# Patient Record
Sex: Female | Born: 1960 | Race: White | Hispanic: No
Health system: Southern US, Community
[De-identification: ages and names within clinical notes are randomized; demographics above are authoritative.]

---

## 2008-01-01 ENCOUNTER — Ambulatory Visit: Payer: Self-pay | Admitting: Family Medicine

## 2008-02-18 ENCOUNTER — Ambulatory Visit: Payer: Self-pay | Admitting: Family Medicine

## 2008-03-19 ENCOUNTER — Ambulatory Visit: Payer: Self-pay | Admitting: Family Medicine

## 2008-08-03 ENCOUNTER — Encounter: Admission: RE | Admit: 2008-08-03 | Discharge: 2008-08-03 | Payer: Self-pay | Admitting: Family Medicine

## 2009-09-28 ENCOUNTER — Encounter: Admission: RE | Admit: 2009-09-28 | Discharge: 2009-09-28 | Payer: Self-pay | Admitting: Family Medicine

## 2010-10-13 ENCOUNTER — Other Ambulatory Visit: Payer: Self-pay | Admitting: Family Medicine

## 2010-10-13 DIAGNOSIS — Z1231 Encounter for screening mammogram for malignant neoplasm of breast: Secondary | ICD-10-CM

## 2010-10-31 ENCOUNTER — Ambulatory Visit: Payer: Self-pay

## 2010-11-30 ENCOUNTER — Ambulatory Visit
Admission: RE | Admit: 2010-11-30 | Discharge: 2010-11-30 | Disposition: A | Discharge status: Home / Self Care | Payer: BC Managed Care – PPO | Source: Ambulatory Visit | Attending: Family Medicine | Admitting: Family Medicine

## 2010-11-30 DIAGNOSIS — Z1231 Encounter for screening mammogram for malignant neoplasm of breast: Secondary | ICD-10-CM

## 2012-01-04 ENCOUNTER — Other Ambulatory Visit: Payer: Self-pay | Admitting: Family Medicine

## 2012-01-04 DIAGNOSIS — Z1231 Encounter for screening mammogram for malignant neoplasm of breast: Secondary | ICD-10-CM

## 2012-01-30 ENCOUNTER — Ambulatory Visit
Admission: RE | Admit: 2012-01-30 | Discharge: 2012-01-30 | Disposition: A | Discharge status: Home / Self Care | Payer: BC Managed Care – PPO | Source: Ambulatory Visit | Attending: Family Medicine | Admitting: Family Medicine

## 2012-01-30 DIAGNOSIS — Z1231 Encounter for screening mammogram for malignant neoplasm of breast: Secondary | ICD-10-CM

## 2012-01-31 ENCOUNTER — Other Ambulatory Visit: Payer: Self-pay | Admitting: Family Medicine

## 2012-01-31 DIAGNOSIS — R928 Other abnormal and inconclusive findings on diagnostic imaging of breast: Secondary | ICD-10-CM

## 2012-02-12 ENCOUNTER — Ambulatory Visit
Admission: RE | Admit: 2012-02-12 | Discharge: 2012-02-12 | Disposition: A | Discharge status: Home / Self Care | Payer: BC Managed Care – PPO | Source: Ambulatory Visit | Attending: Family Medicine | Admitting: Family Medicine

## 2012-02-12 DIAGNOSIS — R928 Other abnormal and inconclusive findings on diagnostic imaging of breast: Secondary | ICD-10-CM

## 2012-11-21 ENCOUNTER — Ambulatory Visit: Payer: BC Managed Care – PPO | Attending: Family Medicine | Admitting: Physical Therapy

## 2013-01-30 ENCOUNTER — Ambulatory Visit: Payer: BC Managed Care – PPO | Attending: Family Medicine | Admitting: Physical Therapy

## 2013-01-30 DIAGNOSIS — M2420 Disorder of ligament, unspecified site: Secondary | ICD-10-CM | POA: Insufficient documentation

## 2013-01-30 DIAGNOSIS — IMO0001 Reserved for inherently not codable concepts without codable children: Secondary | ICD-10-CM | POA: Insufficient documentation

## 2013-01-30 DIAGNOSIS — M629 Disorder of muscle, unspecified: Secondary | ICD-10-CM | POA: Insufficient documentation

## 2013-02-10 ENCOUNTER — Ambulatory Visit: Payer: BC Managed Care – PPO | Admitting: Physical Therapy

## 2013-02-13 ENCOUNTER — Ambulatory Visit: Payer: BC Managed Care – PPO | Admitting: Physical Therapy

## 2013-02-26 ENCOUNTER — Ambulatory Visit: Payer: BC Managed Care – PPO | Admitting: Physical Therapy

## 2013-03-14 ENCOUNTER — Other Ambulatory Visit: Payer: Self-pay | Admitting: Family Medicine

## 2013-03-14 DIAGNOSIS — Z1231 Encounter for screening mammogram for malignant neoplasm of breast: Secondary | ICD-10-CM

## 2013-03-31 ENCOUNTER — Ambulatory Visit: Payer: BC Managed Care – PPO

## 2013-05-16 ENCOUNTER — Ambulatory Visit
Admission: RE | Admit: 2013-05-16 | Discharge: 2013-05-16 | Disposition: A | Discharge status: Home / Self Care | Payer: BC Managed Care – PPO | Source: Ambulatory Visit | Attending: Family Medicine | Admitting: Family Medicine

## 2013-05-16 ENCOUNTER — Encounter (INDEPENDENT_AMBULATORY_CARE_PROVIDER_SITE_OTHER): Payer: Self-pay

## 2013-05-16 DIAGNOSIS — Z1231 Encounter for screening mammogram for malignant neoplasm of breast: Secondary | ICD-10-CM

## 2013-06-06 ENCOUNTER — Ambulatory Visit: Payer: BC Managed Care – PPO | Attending: Family Medicine | Admitting: Physical Therapy

## 2013-06-06 DIAGNOSIS — M629 Disorder of muscle, unspecified: Secondary | ICD-10-CM | POA: Insufficient documentation

## 2013-06-06 DIAGNOSIS — M2420 Disorder of ligament, unspecified site: Secondary | ICD-10-CM | POA: Insufficient documentation

## 2013-06-06 DIAGNOSIS — IMO0001 Reserved for inherently not codable concepts without codable children: Secondary | ICD-10-CM | POA: Insufficient documentation

## 2013-06-06 DIAGNOSIS — R32 Unspecified urinary incontinence: Secondary | ICD-10-CM | POA: Insufficient documentation

## 2013-06-27 ENCOUNTER — Ambulatory Visit: Payer: BC Managed Care – PPO | Admitting: Physical Therapy

## 2013-08-12 ENCOUNTER — Ambulatory Visit: Payer: BC Managed Care – PPO | Admitting: Physical Therapy

## 2014-07-03 ENCOUNTER — Other Ambulatory Visit: Payer: Self-pay | Admitting: Nurse Practitioner

## 2014-07-03 DIAGNOSIS — M810 Age-related osteoporosis without current pathological fracture: Secondary | ICD-10-CM

## 2014-07-03 DIAGNOSIS — Z1231 Encounter for screening mammogram for malignant neoplasm of breast: Secondary | ICD-10-CM

## 2014-08-05 ENCOUNTER — Ambulatory Visit: Payer: BC Managed Care – PPO

## 2014-08-05 ENCOUNTER — Other Ambulatory Visit: Payer: BC Managed Care – PPO

## 2014-08-21 ENCOUNTER — Other Ambulatory Visit: Payer: Self-pay | Admitting: Orthopedic Surgery

## 2014-08-21 DIAGNOSIS — M25511 Pain in right shoulder: Secondary | ICD-10-CM

## 2014-08-27 ENCOUNTER — Ambulatory Visit
Admission: RE | Admit: 2014-08-27 | Discharge: 2014-08-27 | Disposition: A | Discharge status: Home / Self Care | Payer: BC Managed Care – PPO | Source: Ambulatory Visit | Attending: Nurse Practitioner | Admitting: Nurse Practitioner

## 2014-08-27 DIAGNOSIS — Z1231 Encounter for screening mammogram for malignant neoplasm of breast: Secondary | ICD-10-CM

## 2014-08-27 DIAGNOSIS — M810 Age-related osteoporosis without current pathological fracture: Secondary | ICD-10-CM

## 2014-09-02 ENCOUNTER — Other Ambulatory Visit: Payer: BC Managed Care – PPO

## 2015-08-17 ENCOUNTER — Other Ambulatory Visit: Payer: Self-pay | Admitting: Nurse Practitioner

## 2015-08-17 DIAGNOSIS — Z1231 Encounter for screening mammogram for malignant neoplasm of breast: Secondary | ICD-10-CM

## 2015-08-30 ENCOUNTER — Ambulatory Visit
Admission: RE | Admit: 2015-08-30 | Discharge: 2015-08-30 | Disposition: A | Discharge status: Home / Self Care | Payer: BC Managed Care – PPO | Source: Ambulatory Visit | Attending: Nurse Practitioner | Admitting: Nurse Practitioner

## 2015-08-30 DIAGNOSIS — Z1231 Encounter for screening mammogram for malignant neoplasm of breast: Secondary | ICD-10-CM

## 2016-09-05 ENCOUNTER — Other Ambulatory Visit: Payer: Self-pay | Admitting: Family Medicine

## 2016-09-05 DIAGNOSIS — Z1231 Encounter for screening mammogram for malignant neoplasm of breast: Secondary | ICD-10-CM

## 2016-09-14 ENCOUNTER — Ambulatory Visit
Admission: RE | Admit: 2016-09-14 | Discharge: 2016-09-14 | Disposition: A | Discharge status: Home / Self Care | Payer: BC Managed Care – PPO | Source: Ambulatory Visit | Attending: Family Medicine | Admitting: Family Medicine

## 2016-09-14 DIAGNOSIS — Z1231 Encounter for screening mammogram for malignant neoplasm of breast: Secondary | ICD-10-CM

## 2017-06-10 ENCOUNTER — Encounter (HOSPITAL_COMMUNITY): Payer: Self-pay | Admitting: Emergency Medicine

## 2017-06-10 ENCOUNTER — Emergency Department (HOSPITAL_COMMUNITY): Payer: BC Managed Care – PPO

## 2017-06-10 ENCOUNTER — Emergency Department (HOSPITAL_COMMUNITY)
Admission: EM | Admit: 2017-06-10 | Discharge: 2017-06-10 | Disposition: A | Discharge status: Home / Self Care | Payer: BC Managed Care – PPO | Attending: Emergency Medicine | Admitting: Emergency Medicine

## 2017-06-10 DIAGNOSIS — Y9301 Activity, walking, marching and hiking: Secondary | ICD-10-CM | POA: Diagnosis not present

## 2017-06-10 DIAGNOSIS — S0993XA Unspecified injury of face, initial encounter: Secondary | ICD-10-CM | POA: Diagnosis present

## 2017-06-10 DIAGNOSIS — Y92520 Airport as the place of occurrence of the external cause: Secondary | ICD-10-CM | POA: Insufficient documentation

## 2017-06-10 DIAGNOSIS — W100XXA Fall (on)(from) escalator, initial encounter: Secondary | ICD-10-CM | POA: Insufficient documentation

## 2017-06-10 DIAGNOSIS — S0181XA Laceration without foreign body of other part of head, initial encounter: Secondary | ICD-10-CM | POA: Insufficient documentation

## 2017-06-10 DIAGNOSIS — Y999 Unspecified external cause status: Secondary | ICD-10-CM | POA: Insufficient documentation

## 2017-06-10 NOTE — ED Provider Notes (Signed)
[facility]-EMERGENCY DEPT Provider Note   CSN: [id number] Arrival date & time: 06/10/17  1237     History   Chief Complaint Chief Complaint  Patient presents with  . Fall  . Laceration    HPI Victoria Jacobs is a 57 y.o. female.  Victoria Jacobs is a 57 y.o. Female who is otherwise healthy, presents to the emergency department via EMS for evaluation of fall and facial laceration.  [place] she tripped and fell striking her chin and right cheek.  Patient reports mild headache but denies any loss of consciousness, no vision changes, nausea, vomiting, dizziness.  Patient denies any weakness numbness or tingling in any of her extremities.  Patient is not on any blood thinners.  She reports her chin received most of the impact she has a small laceration to the underside of her right jaw and a small abrasion to the right cheek.  Patient denies any neck pain, she did not injure anything else from the fall.     History reviewed. No pertinent past medical history.  There are no active problems to display for this patient.   History reviewed. No pertinent surgical history.   OB History   None      Home Medications    Prior to Admission medications   Not on File    Family History No family history on file.  Social History Social History   Tobacco Use  . Smoking status: Not on file  Substance Use Topics  . Alcohol use: Not on file  . Drug use: Not on file     Allergies   Patient has no known allergies.   Review of Systems Review of Systems  Constitutional: Negative for chills and fever.  HENT: Negative for dental problem, ear pain and facial swelling.   Eyes: Negative for pain and visual disturbance.  Gastrointestinal: Negative for nausea and vomiting.  Musculoskeletal: Negative for arthralgias, back pain, myalgias, neck pain and neck stiffness.  Skin: Positive for wound.    Neurological: Positive for headaches. Negative for dizziness, tremors, seizures, syncope, facial asymmetry, speech difficulty, weakness, light-headedness and numbness.     Physical Exam Updated Vital Signs BP 117/76   Pulse 76   Temp 98.6 F (37 C)   Resp 18   SpO2 97%   Physical Exam  Constitutional: She is oriented to person, place, and time. She appears well-developed and well-nourished. No distress.  HENT:  Head: Normocephalic and atraumatic.  Focal tenderness to palpation over the chin and right jawline with small 1 cm laceration to the underside of the right jaw, there is also tenderness to palpation over the right cheek with small abrasion, no evidence of injury to the nose, no tenderness or palpable hematoma over the scalp, no step-off, no battle sign or hemotympanum.  Eyes: Pupils are equal, round, and reactive to light. EOM are normal. Right eye exhibits no discharge. Left eye exhibits no discharge.  No nystagmus  Neck:  No midline C-spine tenderness, range of motion intact  Pulmonary/Chest: Effort normal. No respiratory distress.  Musculoskeletal:  T-spine and L-spine nontender to palpation at midline. Patient moves all extremities without difficulty. All joints supple and easily movable, no erythema, swelling or palpable deformity, all compartments soft.  Neurological: She is alert and oriented to person, place, and time. Coordination normal.  Speech is clear, able to follow commands CN III-XII intact Normal strength in upper and lower  extremities bilaterally including dorsiflexion and plantar flexion, strong and equal grip strength Sensation normal to light and sharp touch Moves extremities without ataxia, coordination intact  Skin: Skin is warm and dry. Capillary refill takes less than 2 seconds. She is not diaphoretic.  Psychiatric: She has a normal mood and affect. Her behavior is normal.  Nursing note and vitals reviewed.    ED Treatments / Results   Labs (all labs ordered are listed, but only abnormal results are displayed) Labs Reviewed - No data to display  EKG None  Radiology Ct Maxillofacial Wo Contrast  Result Date: 06/10/2017 CLINICAL DATA:  Fall down escalator today. Right facial pain, bruising, and laceration. Initial encounter. EXAM: CT MAXILLOFACIAL WITHOUT CONTRAST TECHNIQUE: Multidetector CT imaging of the maxillofacial structures was performed. Multiplanar CT image reconstructions were also generated. COMPARISON:  None. FINDINGS: Osseous: No acute fracture or other significant osseous abnormality. Orbits: No fracture identified. Unremarkable appearance of globes and intraorbital anatomy. Sinuses:  No air-fluid levels or other acute findings. Soft tissues:  No acute findings. IMPRESSION: Negative.  No evidence of orbital or facial bone fracture. Electronically Signed   By: Myles RosenthalJohn  Stahl M.D.   On: 06/10/2017 13:33    Procedures .Marland Kitchen.Laceration Repair Date/Time: 06/10/2017 3:31 PM Performed by: Dartha LodgeFord, Kelsey N, PA-C Authorized by: Dartha LodgeFord, Kelsey N, PA-C   Consent:    Consent obtained:  Verbal   Consent given by:  Patient   Risks discussed:  Poor cosmetic result, poor wound healing, pain and infection   Alternatives discussed:  No treatment Anesthesia (see MAR for exact dosages):    Anesthesia method:  None Laceration details:    Location:  Face   Face location:  Chin   Length (cm):  1   Depth (mm):  3 Repair type:    Repair type:  Simple Exploration:    Hemostasis achieved with:  Direct pressure   Wound exploration: entire depth of wound probed and visualized     Wound extent: areolar tissue violated   Treatment:    Amount of cleaning:  Standard Skin repair:    Repair method:  Tissue adhesive Approximation:    Approximation:  Close Post-procedure details:    Patient tolerance of procedure:  Tolerated well, no immediate complications   (including critical care time)  Medications Ordered in ED Medications - No  data to display   Initial Impression / Assessment and Plan / ED Course  I have reviewed the triage vital signs and the nursing notes.  Pertinent labs & imaging results that were available during my care of the patient were reviewed by me and considered in my medical decision making (see chart for details).  Patient presents to the ED for evaluation after fall at the airport, she sustained small laceration to the underside of the right chin as well as a small abrasion to the right cheek.  No loss of consciousness, mild headache, no nausea or vomiting, vision changes or dizziness.  Normal neurologic exam.  No midline C-spine tenderness.  C-spine cleared Via Nexus criteria.  Do not feel head or neck imaging is needed.  Given focal bony tenderness over the chin and right cheek CT maxillofacial ordered.  No evidence of facial bone fracture.  Small laceration repaired with Dermabond.  At this time patient is stable for discharge home.  Patient follow-up with her primary doctor.  Return precautions discussed.  Patient stresses understanding and is in agreement with plan.  Patient discussed with Dr. Patria Maneampos, who saw patient as well and  agrees with plan.   Final Clinical Impressions(s) / ED Diagnoses   Final diagnoses:  Facial laceration, initial encounter    ED Discharge Orders    None       Legrand Rams 06/10/17 1533    Azalia Bilis, MD 06/10/17 2311

## 2017-06-10 NOTE — ED Notes (Signed)
Bed: WTR6 Expected date:  Expected time:  Means of arrival:  Comments: 57 yo fall

## 2017-06-10 NOTE — ED Triage Notes (Signed)
Per EMS, patient from airport, c/o trip and fall down escalator. Laceration to chin and right cheek. C/o headache and denies LOC. Denies taking blood thinners.

## 2017-08-27 ENCOUNTER — Other Ambulatory Visit: Payer: Self-pay | Admitting: Family Medicine

## 2017-08-27 DIAGNOSIS — Z1231 Encounter for screening mammogram for malignant neoplasm of breast: Secondary | ICD-10-CM

## 2017-09-25 ENCOUNTER — Ambulatory Visit: Payer: BC Managed Care – PPO

## 2017-10-23 ENCOUNTER — Ambulatory Visit
Admission: RE | Admit: 2017-10-23 | Discharge: 2017-10-23 | Disposition: A | Discharge status: Home / Self Care | Payer: BC Managed Care – PPO | Source: Ambulatory Visit | Attending: Family Medicine | Admitting: Family Medicine

## 2017-10-23 DIAGNOSIS — Z1231 Encounter for screening mammogram for malignant neoplasm of breast: Secondary | ICD-10-CM

## 2019-11-14 ENCOUNTER — Other Ambulatory Visit: Payer: Self-pay | Admitting: Family Medicine

## 2019-11-14 DIAGNOSIS — Z1231 Encounter for screening mammogram for malignant neoplasm of breast: Secondary | ICD-10-CM

## 2020-01-08 ENCOUNTER — Ambulatory Visit: Payer: BC Managed Care – PPO

## 2020-02-13 ENCOUNTER — Ambulatory Visit
Admission: RE | Admit: 2020-02-13 | Discharge: 2020-02-13 | Disposition: A | Discharge status: Home / Self Care | Payer: BC Managed Care – PPO | Source: Ambulatory Visit | Attending: Family Medicine | Admitting: Family Medicine

## 2020-02-13 ENCOUNTER — Other Ambulatory Visit: Payer: Self-pay

## 2020-02-13 DIAGNOSIS — Z1231 Encounter for screening mammogram for malignant neoplasm of breast: Secondary | ICD-10-CM

## 2020-02-29 ENCOUNTER — Other Ambulatory Visit: Payer: Self-pay

## 2020-02-29 ENCOUNTER — Emergency Department (HOSPITAL_COMMUNITY): Payer: BC Managed Care – PPO

## 2020-02-29 ENCOUNTER — Emergency Department (HOSPITAL_COMMUNITY)
Admission: EM | Admit: 2020-02-29 | Discharge: 2020-03-01 | Disposition: A | Discharge status: Home / Self Care | Payer: BC Managed Care – PPO | Attending: Emergency Medicine | Admitting: Emergency Medicine

## 2020-02-29 ENCOUNTER — Encounter (HOSPITAL_COMMUNITY): Payer: Self-pay

## 2020-02-29 DIAGNOSIS — S161XXA Strain of muscle, fascia and tendon at neck level, initial encounter: Secondary | ICD-10-CM

## 2020-02-29 DIAGNOSIS — M542 Cervicalgia: Secondary | ICD-10-CM | POA: Insufficient documentation

## 2020-02-29 DIAGNOSIS — R519 Headache, unspecified: Secondary | ICD-10-CM | POA: Insufficient documentation

## 2020-02-29 DIAGNOSIS — S0990XA Unspecified injury of head, initial encounter: Secondary | ICD-10-CM

## 2020-02-29 DIAGNOSIS — Y92410 Unspecified street and highway as the place of occurrence of the external cause: Secondary | ICD-10-CM | POA: Insufficient documentation
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
## 2020-03-01 MED ORDER — CYCLOBENZAPRINE HCL 10 MG PO TABS: 10.0000 mg | ORAL_TABLET | Freq: Three times a day (TID) | ORAL | 0 refills | Status: AC | PRN

## 2020-03-01 NOTE — Discharge Instructions (Addendum)
Take ibuprofen 600 mg every 6 hours as needed for pain.  Take Flexeril as prescribed as needed for pain not relieved with ibuprofen.  Rest for the next several days until feeling better, then gradually reintroduce normal activity.  Return to the emergency department if you develop worsening headache, worsening neck pain, or other new and concerning symptoms.

## 2020-06-14 ENCOUNTER — Encounter: Payer: BC Managed Care – PPO | Admitting: Gastroenterology

## 2021-06-15 IMAGING — MG MM DIGITAL SCREENING BILAT W/ TOMO AND CAD
8 series · 9 of 24 positions shown · non-contrast
Comparison: Previous exam(s).

CLINICAL DATA: Screening.

EXAM:
DIGITAL SCREENING BILATERAL MAMMOGRAM WITH TOMOSYNTHESIS AND CAD
TECHNIQUE: Bilateral screening digital craniocaudal and mediolateral oblique
mammograms were obtained. Bilateral screening digital breast
tomosynthesis was performed. The images were evaluated with
computer-aided detection.

[L CC synth-2D]
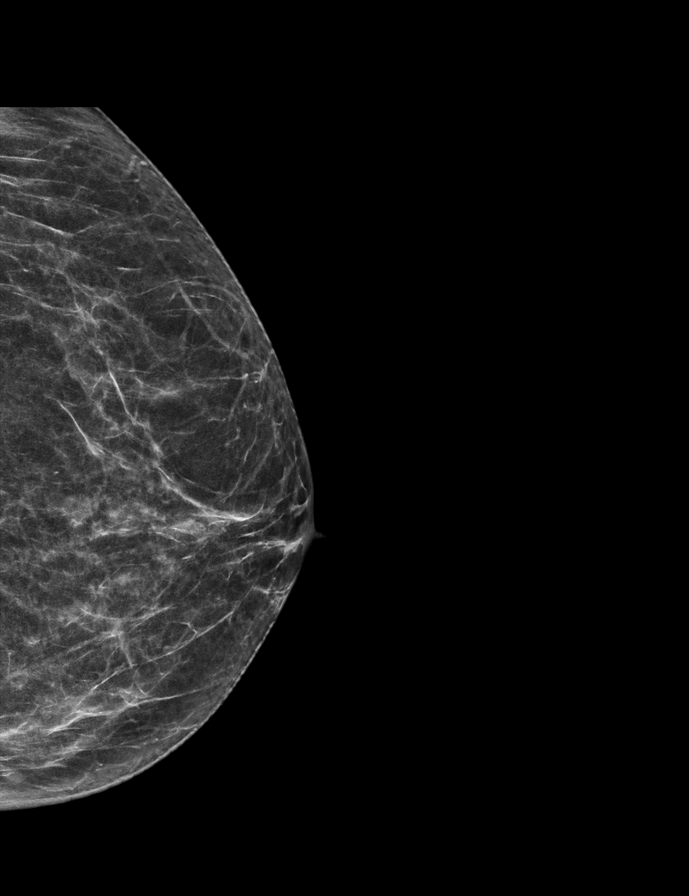

[R CC synth-2D]
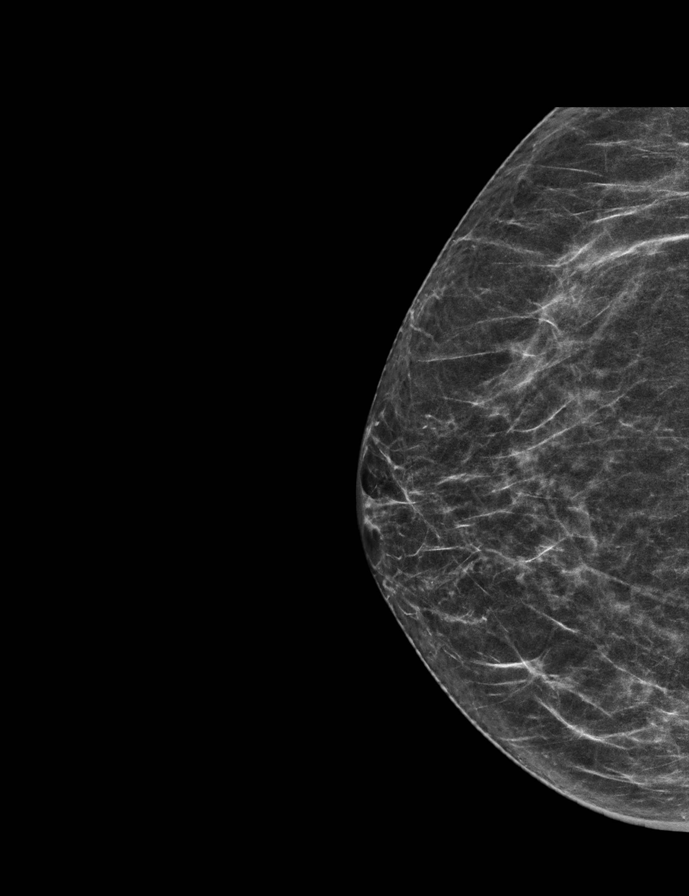

[L MLO synth-2D]
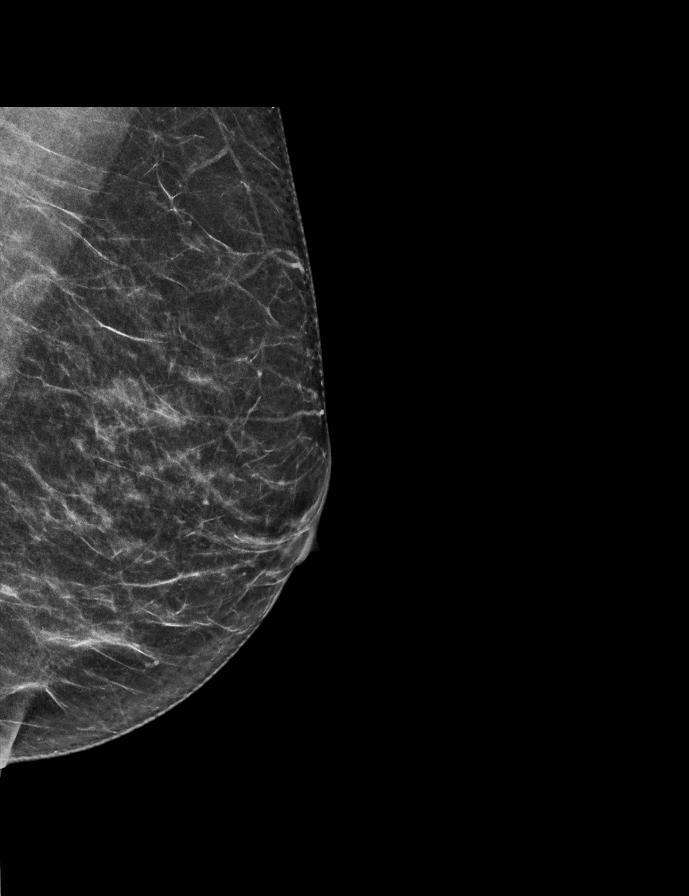

[R MLO synth-2D]
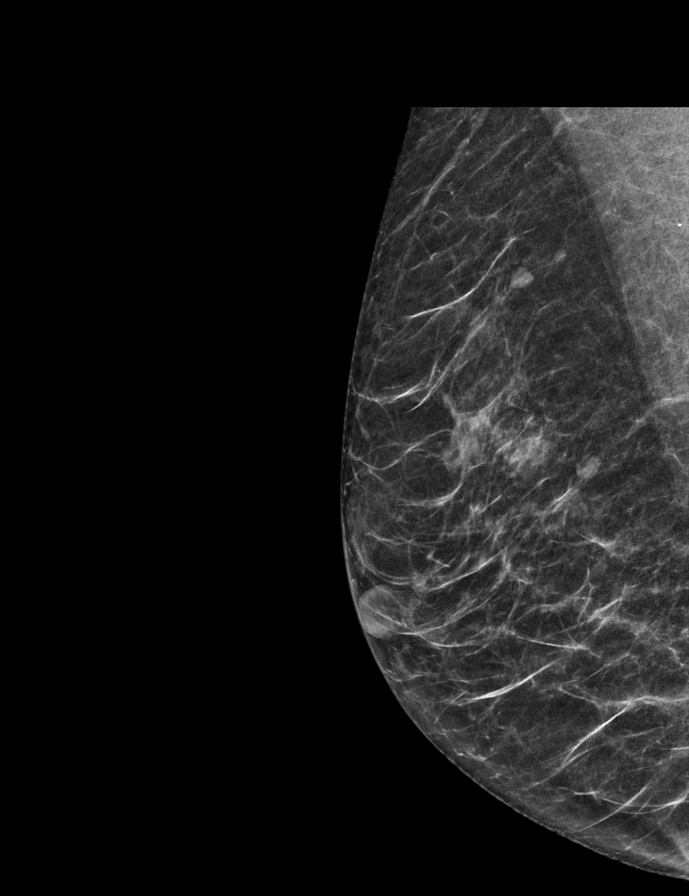

[L MLO tomo · 2 of 53 frames shown]
[frame 18/53]
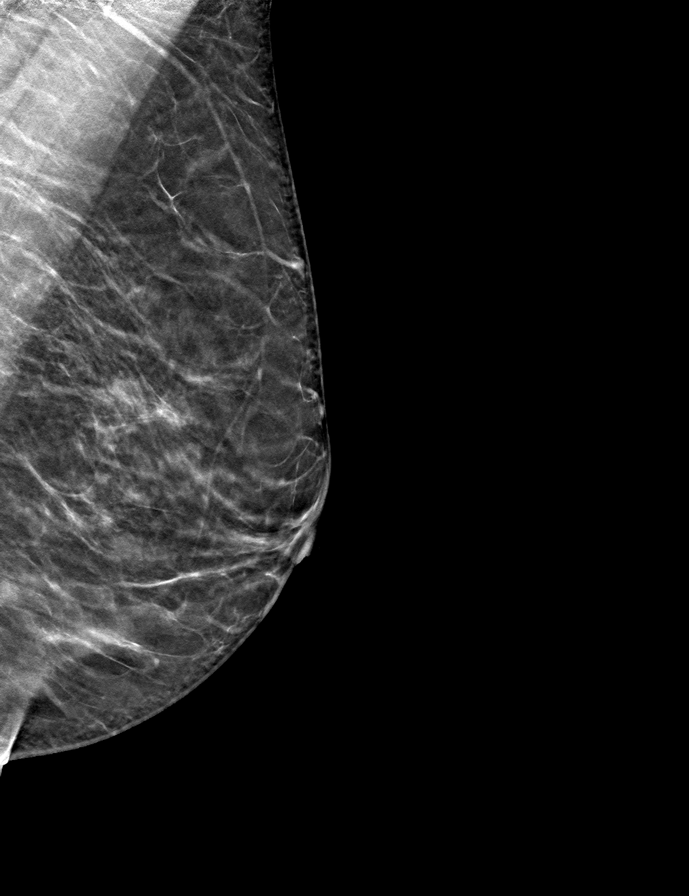
[frame 27/53]
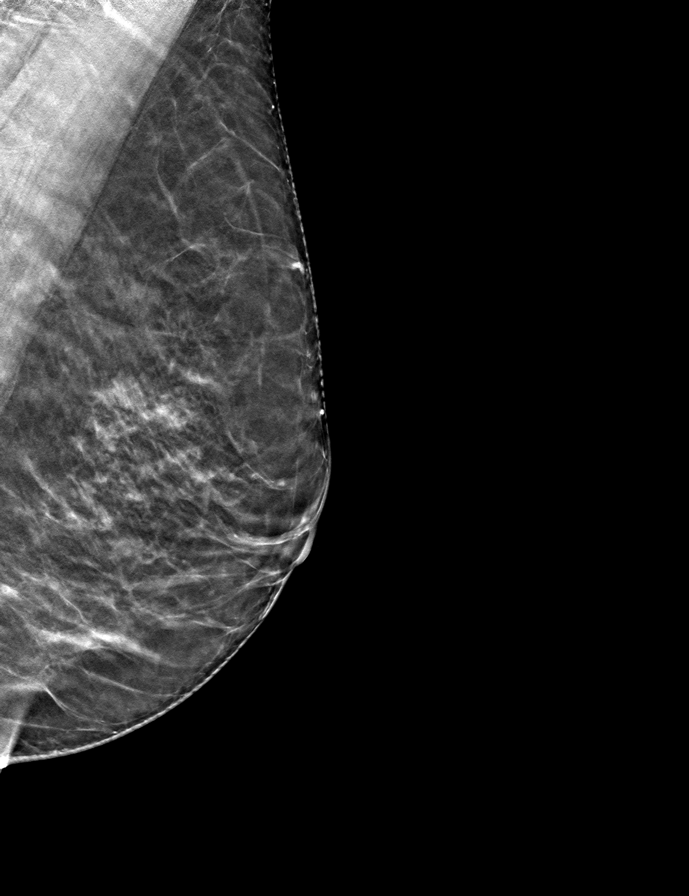

[L CC tomo · tomo slice 28/55.0]
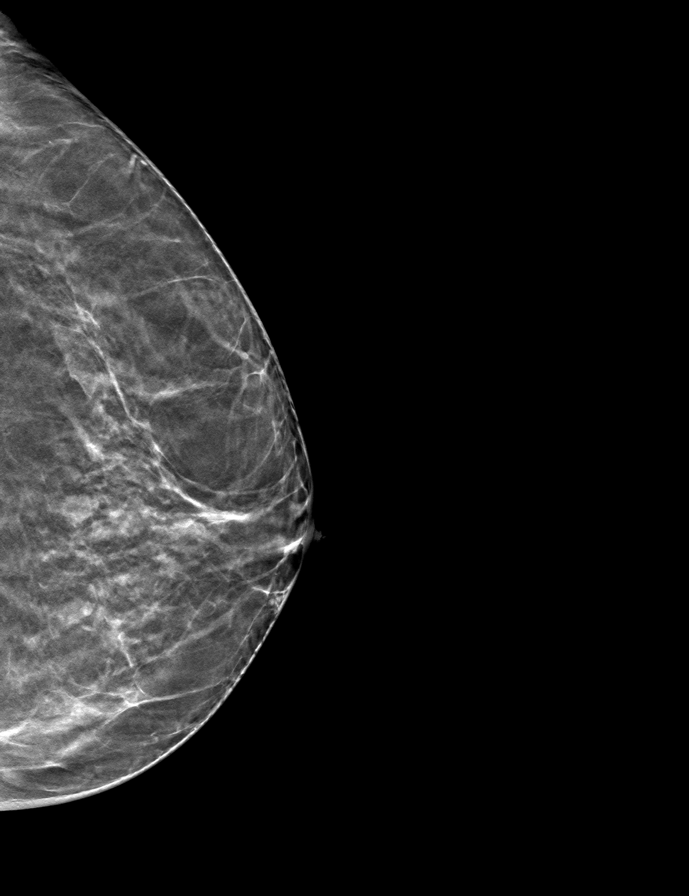

[R MLO tomo · tomo slice 27/53.0]
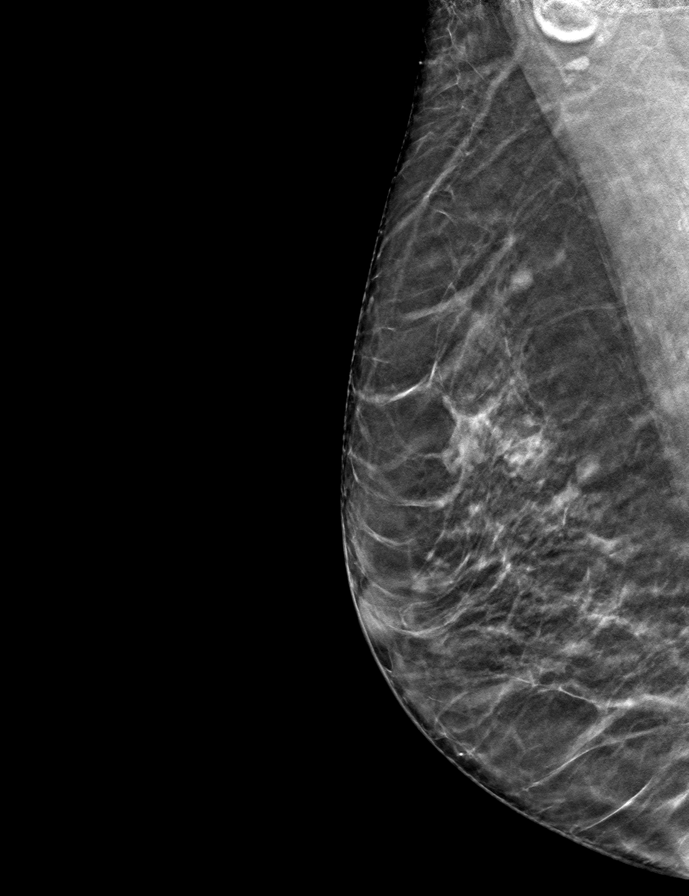

[R CC tomo · tomo slice 29/56.0]
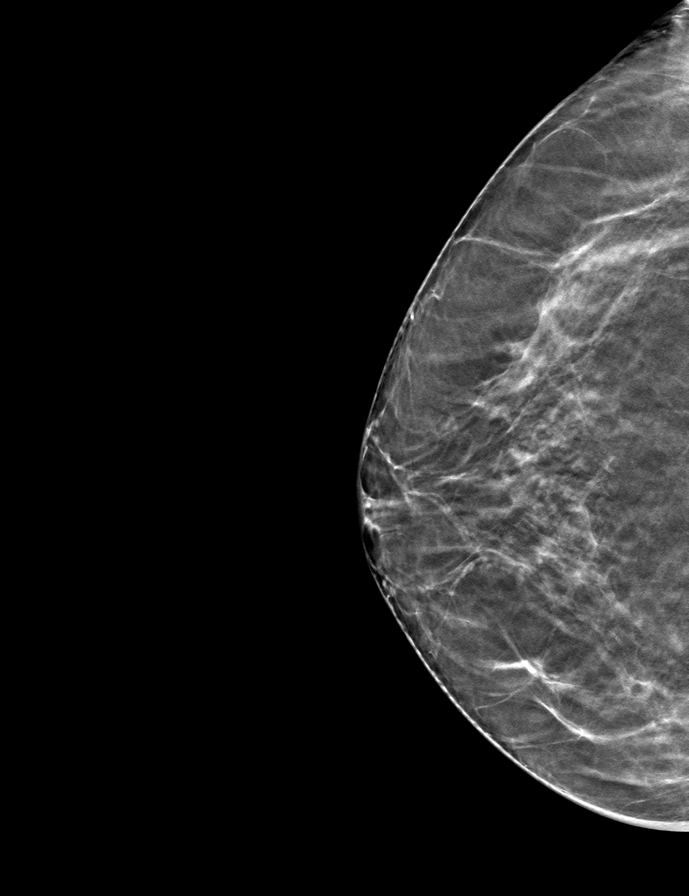

[9 of 24 positions shown; findings below may reference images not displayed]

ACR Breast Density Category b: There are scattered areas of
fibroglandular density.
FINDINGS: There are no findings suspicious for malignancy.
IMPRESSION: No mammographic evidence of malignancy. A result letter of this
screening mammogram will be mailed directly to the patient.

RECOMMENDATION:
Screening mammogram in one year. (Code:51-O-LD2)

BI-RADS CATEGORY  1: Negative.
